# Patient Record
Sex: Female | Born: 1990 | Race: Black or African American | Hispanic: No | Marital: Single | Smoking: Current every day smoker
Health system: Southern US, Community
[De-identification: ages and names within clinical notes are randomized; demographics above are authoritative.]

## PROBLEM LIST (undated history)

## (undated) DIAGNOSIS — R569 Unspecified convulsions: Secondary | ICD-10-CM

## (undated) DIAGNOSIS — G43909 Migraine, unspecified, not intractable, without status migrainosus: Secondary | ICD-10-CM

---

## 2012-10-01 ENCOUNTER — Encounter (HOSPITAL_BASED_OUTPATIENT_CLINIC_OR_DEPARTMENT_OTHER): Payer: Self-pay | Admitting: *Deleted

## 2012-10-01 ENCOUNTER — Emergency Department (HOSPITAL_BASED_OUTPATIENT_CLINIC_OR_DEPARTMENT_OTHER): Payer: Self-pay

## 2012-10-01 ENCOUNTER — Emergency Department (HOSPITAL_BASED_OUTPATIENT_CLINIC_OR_DEPARTMENT_OTHER)
Admission: EM | Admit: 2012-10-01 | Discharge: 2012-10-01 | Disposition: A | Discharge status: Home / Self Care | Payer: Self-pay | Attending: Emergency Medicine | Admitting: Emergency Medicine

## 2012-10-01 DIAGNOSIS — Z3202 Encounter for pregnancy test, result negative: Secondary | ICD-10-CM | POA: Insufficient documentation

## 2012-10-01 DIAGNOSIS — F17200 Nicotine dependence, unspecified, uncomplicated: Secondary | ICD-10-CM | POA: Insufficient documentation

## 2012-10-01 DIAGNOSIS — R071 Chest pain on breathing: Secondary | ICD-10-CM | POA: Insufficient documentation

## 2012-10-01 DIAGNOSIS — Z79899 Other long term (current) drug therapy: Secondary | ICD-10-CM | POA: Insufficient documentation

## 2012-10-01 DIAGNOSIS — N390 Urinary tract infection, site not specified: Secondary | ICD-10-CM | POA: Insufficient documentation

## 2012-10-01 DIAGNOSIS — G40909 Epilepsy, unspecified, not intractable, without status epilepticus: Secondary | ICD-10-CM | POA: Insufficient documentation

## 2012-10-01 HISTORY — DX: Unspecified convulsions: R56.9

## 2012-10-01 LAB — URINALYSIS, ROUTINE W REFLEX MICROSCOPIC
Protein, ur: NEGATIVE mg/dL
Urobilinogen, UA: 0.2 mg/dL (ref 0.0–1.0)

## 2012-10-01 LAB — PREGNANCY, URINE: Preg Test, Ur: NEGATIVE

## 2012-10-01 LAB — URINE MICROSCOPIC-ADD ON

## 2012-10-01 MED ORDER — CEFTRIAXONE SODIUM 1 G IJ SOLR
1.0000 g | Freq: Once | INTRAMUSCULAR | Status: AC
  Administered 2012-10-01: 1 g via INTRAMUSCULAR
  Filled 2012-10-01: qty 10

## 2012-10-01 MED ORDER — CIPROFLOXACIN HCL 500 MG PO TABS: 500.0000 mg | ORAL_TABLET | Freq: Two times a day (BID) | ORAL | Status: DC

## 2012-10-01 MED ORDER — SODIUM CHLORIDE 0.9 % IV SOLN
Freq: Once | INTRAVENOUS | Status: AC
  Administered 2012-10-01: 21:00:00 via INTRAVENOUS

## 2012-10-01 MED ORDER — HYDROMORPHONE HCL PF 1 MG/ML IJ SOLN
1.0000 mg | Freq: Once | INTRAMUSCULAR | Status: AC
  Administered 2012-10-01: 1 mg via INTRAVENOUS
  Filled 2012-10-01: qty 1

## 2012-10-01 MED ORDER — PROMETHAZINE HCL 25 MG PO TABS: 25.0000 mg | ORAL_TABLET | Freq: Four times a day (QID) | ORAL | Status: AC | PRN

## 2012-10-01 MED ORDER — HYDROCODONE-ACETAMINOPHEN 5-325 MG PO TABS: 2.0000 | ORAL_TABLET | ORAL | Status: DC | PRN

## 2012-10-01 MED ORDER — ONDANSETRON HCL 4 MG/2ML IJ SOLN
4.0000 mg | Freq: Once | INTRAMUSCULAR | Status: AC
  Administered 2012-10-01: 4 mg via INTRAVENOUS
  Filled 2012-10-01: qty 2

## 2012-10-01 MED ORDER — LIDOCAINE HCL (PF) 1 % IJ SOLN
INTRAMUSCULAR | Status: AC
  Administered 2012-10-01: 2.1 mL
  Filled 2012-10-01: qty 5

## 2012-10-01 NOTE — ED Provider Notes (Signed)
9:10 PM  Date: 10/01/2012  Rate: 60  Rhythm: normal sinus rhythm and sinus arrhythmia  QRS Axis: normal  Intervals: normal  ST/T Wave abnormalities: normal  Conduction Disutrbances:none  Narrative Interpretation: Normal EKG  Old EKG Reviewed: none available    Carleene Cooper III, MD 10/01/12 2111

## 2012-10-01 NOTE — ED Provider Notes (Signed)
History     CSN: [id number]  Arrival date & time 10/01/12  2956   First MD Initiated Contact with Patient 10/01/12 2026      Chief Complaint  Patient presents with  . Abdominal Pain    (Consider location/radiation/quality/duration/timing/severity/associated sxs/prior treatment) Patient is a 22 y.o. female presenting with abdominal pain. The history is provided by the patient. No language interpreter was used.  Abdominal Pain Pain location:  Suprapubic Pain quality: aching   Pain radiates to:  Suprapubic region Onset quality:  Sudden Timing:  Constant Progression:  Worsening Relieved by:  Nothing Worsened by:  Nothing tried Associated symptoms: chest pain   Pt complains of pain in her low back.  Pt also complains of pain in her chest for 2 weeks  Past Medical History  Diagnosis Date  . Seizures     History reviewed. No pertinent past surgical history.  History reviewed. No pertinent family history.  History  Substance Use Topics  . Smoking status: Current Every Day Smoker -- 0.50 packs/day    Types: Cigarettes  . Smokeless tobacco: Not on file  . Alcohol Use: No    OB History   Grav Para Term Preterm Abortions TAB SAB Ect Mult Living                  Review of Systems  Cardiovascular: Positive for chest pain.  Gastrointestinal: Positive for abdominal pain.  All other systems reviewed and are negative.    Allergies  Review of patient's allergies indicates no known allergies.  Home Medications   Current Outpatient Rx  Name  Route  Sig  Dispense  Refill  . divalproex (DEPAKOTE) 500 MG DR tablet   Oral   Take 500 mg by mouth 3 (three) times daily.         . traZODone (DESYREL) 50 MG tablet   Oral   Take 50 mg by mouth at bedtime.           BP 135/73  Pulse 89  Temp(Src) 99.3 F (37.4 C) (Oral)  Resp 20  Ht 5\' 9"  (1.753 m)  Wt 160 lb (72.576 kg)  BMI 23.62 kg/m2  SpO2 99%  LMP 09/30/2012  Physical Exam  Nursing note and vitals  reviewed. Constitutional: She appears well-developed and well-nourished.  HENT:  Head: Normocephalic.  Right Ear: External ear normal.  Left Ear: External ear normal.  Nose: Nose normal.  Mouth/Throat: Oropharynx is clear and moist.  Eyes: Pupils are equal, round, and reactive to light.  Neck: Normal range of motion.  Cardiovascular: Normal rate, regular rhythm and normal heart sounds.   Pulmonary/Chest: Effort normal and breath sounds normal.  Abdominal: Soft.  Musculoskeletal: Normal range of motion.  Neurological: She is alert. She has normal reflexes.  Skin: Skin is warm.  Psychiatric: She has a normal mood and affect.    ED Course  Procedures (including critical care time)  Labs Reviewed  URINALYSIS, ROUTINE W REFLEX MICROSCOPIC - Abnormal; Notable for the following:    APPearance CLOUDY (*)    Hgb urine dipstick MODERATE (*)    Leukocytes, UA MODERATE (*)    All other components within normal limits  URINE MICROSCOPIC-ADD ON - Abnormal; Notable for the following:    Squamous Epithelial / LPF FEW (*)    Bacteria, UA FEW (*)    All other components within normal limits  URINE CULTURE  PREGNANCY, URINE   No results found.   No diagnosis found.   Date: 10/01/2012  Rate: 60  Rhythm: normal sinus rhythm  QRS Axis: normal  Intervals: normal  ST/T Wave abnormalities: normal  Conduction Disutrbances:none  Narrative Interpretation:   Old EKG Reviewed: none available   MDM   Results for orders placed during the hospital encounter of 10/01/12  URINALYSIS, ROUTINE W REFLEX MICROSCOPIC      Result Value Range   Color, Urine YELLOW  YELLOW   APPearance CLOUDY (*) CLEAR   Specific Gravity, Urine 1.023  1.005 - 1.030   pH 6.0  5.0 - 8.0   Glucose, UA NEGATIVE  NEGATIVE mg/dL   Hgb urine dipstick MODERATE (*) NEGATIVE   Bilirubin Urine NEGATIVE  NEGATIVE   Ketones, ur NEGATIVE  NEGATIVE mg/dL   Protein, ur NEGATIVE  NEGATIVE mg/dL   Urobilinogen, UA 0.2  0.0 - 1.0  mg/dL   Nitrite NEGATIVE  NEGATIVE   Leukocytes, UA MODERATE (*) NEGATIVE  PREGNANCY, URINE      Result Value Range   Preg Test, Ur NEGATIVE  NEGATIVE  URINE MICROSCOPIC-ADD ON      Result Value Range   Squamous Epithelial / LPF FEW (*) RARE   WBC, UA 21-50  <3 WBC/hpf   RBC / HPF 3-6  <3 RBC/hpf   Bacteria, UA FEW (*) RARE   Dg Chest 2 View Pt given Iv rocephin and zofran.    Rx for cipro bid,  Hydrocodone for pain,  zofran  10/01/2012  *RADIOLOGY REPORT*  Clinical Data: Abdominal pain, nausea, vomiting, and diarrhea for 1 hour.  CHEST - 2 VIEW  Comparison: None.  Findings: The heart size and pulmonary vascularity are normal. The lungs appear clear and expanded without focal air space disease or consolidation. No blunting of the costophrenic angles.  No pneumothorax.  Mediastinal contours appear intact.  IMPRESSION: No evidence of active pulmonary disease.   Original Report Authenticated By: Burman Nieves, M.D.            Lonia Skinner [place], [place] 10/01/12 2206

## 2012-10-01 NOTE — ED Notes (Signed)
Pt c/o abd pain with n/v/d x 1 hr 

## 2012-10-01 NOTE — ED Notes (Signed)
Pt reports still unable to give urine sample at this time.

## 2012-10-01 NOTE — ED Notes (Signed)
Karen, PA-C at bedside.  

## 2012-10-02 NOTE — ED Provider Notes (Signed)
Medical screening examination/treatment/procedure(s) were performed by non-physician practitioner and as supervising physician I was immediately available for consultation/collaboration.   Carleene Cooper III, MD 10/02/12 [phone]

## 2012-10-03 LAB — URINE CULTURE: Colony Count: 25000

## 2012-10-04 ENCOUNTER — Telehealth (HOSPITAL_COMMUNITY): Payer: Self-pay | Admitting: Emergency Medicine

## 2012-10-04 NOTE — ED Notes (Signed)
Patient has +Urine culture. Checking to see if appropriately treated. °

## 2012-10-04 NOTE — ED Notes (Signed)
+  Urine. Patient treated with Cipro. Sensitive to same. Per protocol MD. °

## 2013-02-07 ENCOUNTER — Encounter (HOSPITAL_BASED_OUTPATIENT_CLINIC_OR_DEPARTMENT_OTHER): Payer: Self-pay | Admitting: *Deleted

## 2013-02-07 ENCOUNTER — Emergency Department (HOSPITAL_BASED_OUTPATIENT_CLINIC_OR_DEPARTMENT_OTHER)
Admission: EM | Admit: 2013-02-07 | Discharge: 2013-02-07 | Disposition: A | Discharge status: Home / Self Care | Payer: Self-pay | Attending: Emergency Medicine | Admitting: Emergency Medicine

## 2013-02-07 ENCOUNTER — Emergency Department (HOSPITAL_BASED_OUTPATIENT_CLINIC_OR_DEPARTMENT_OTHER): Payer: Self-pay

## 2013-02-07 DIAGNOSIS — R296 Repeated falls: Secondary | ICD-10-CM | POA: Insufficient documentation

## 2013-02-07 DIAGNOSIS — F17200 Nicotine dependence, unspecified, uncomplicated: Secondary | ICD-10-CM | POA: Insufficient documentation

## 2013-02-07 DIAGNOSIS — F10920 Alcohol use, unspecified with intoxication, uncomplicated: Secondary | ICD-10-CM

## 2013-02-07 DIAGNOSIS — W19XXXA Unspecified fall, initial encounter: Secondary | ICD-10-CM

## 2013-02-07 DIAGNOSIS — Y929 Unspecified place or not applicable: Secondary | ICD-10-CM | POA: Insufficient documentation

## 2013-02-07 DIAGNOSIS — F1010 Alcohol abuse, uncomplicated: Secondary | ICD-10-CM | POA: Insufficient documentation

## 2013-02-07 DIAGNOSIS — IMO0002 Reserved for concepts with insufficient information to code with codable children: Secondary | ICD-10-CM | POA: Insufficient documentation

## 2013-02-07 DIAGNOSIS — T148XXA Other injury of unspecified body region, initial encounter: Secondary | ICD-10-CM

## 2013-02-07 DIAGNOSIS — G40909 Epilepsy, unspecified, not intractable, without status epilepticus: Secondary | ICD-10-CM | POA: Insufficient documentation

## 2013-02-07 DIAGNOSIS — Z79899 Other long term (current) drug therapy: Secondary | ICD-10-CM | POA: Insufficient documentation

## 2013-02-07 DIAGNOSIS — R1110 Vomiting, unspecified: Secondary | ICD-10-CM | POA: Insufficient documentation

## 2013-02-07 DIAGNOSIS — Y939 Activity, unspecified: Secondary | ICD-10-CM | POA: Insufficient documentation

## 2013-02-07 MED ORDER — DIVALPROEX SODIUM 250 MG PO DR TAB
250.0000 mg | DELAYED_RELEASE_TABLET | Freq: Once | ORAL | Status: AC
  Administered 2013-02-07: 250 mg via ORAL
  Filled 2013-02-07: qty 1

## 2013-02-07 MED ORDER — ONDANSETRON 8 MG PO TBDP
ORAL_TABLET | ORAL | Status: AC
  Filled 2013-02-07: qty 1

## 2013-02-07 MED ORDER — ONDANSETRON 8 MG PO TBDP: ORAL_TABLET | ORAL | Status: DC

## 2013-02-07 MED ORDER — SODIUM CHLORIDE 0.9 % IV SOLN
Freq: Once | INTRAVENOUS | Status: AC
  Administered 2013-02-07: 05:00:00 via INTRAVENOUS

## 2013-02-07 MED ORDER — ONDANSETRON HCL 4 MG/2ML IJ SOLN
4.0000 mg | Freq: Once | INTRAMUSCULAR | Status: AC
  Administered 2013-02-07: 4 mg via INTRAVENOUS
  Filled 2013-02-07: qty 2

## 2013-02-07 MED ORDER — ONDANSETRON 8 MG PO TBDP
8.0000 mg | ORAL_TABLET | Freq: Once | ORAL | Status: AC
  Administered 2013-02-07: 8 mg via ORAL

## 2013-02-07 NOTE — ED Provider Notes (Signed)
History    CSN: [id number] Arrival date & time 02/07/13  0356  First MD Initiated Contact with Patient 02/07/13 [phone]     Chief Complaint  Patient presents with  . Fall  . Head Injury  . Alcohol Intoxication  . Emesis   (Consider location/radiation/quality/duration/timing/severity/associated sxs/prior Treatment) Patient is a 22 y.o. female presenting with fall and vomiting. The history is provided by the patient and a relative.  Fall This is a new problem. The current episode started 1 to 2 hours ago. The problem occurs constantly. The problem has not changed since onset.Nothing aggravates the symptoms. Nothing relieves the symptoms. She has tried nothing for the symptoms. The treatment provided no relief.  Emesis Severity:  Moderate Timing:  Constant Quality:  Stomach contents Progression:  Resolved Chronicity:  New Context: not self-induced   Relieved by:  Nothing Worsened by:  Nothing tried Ineffective treatments:  None tried Risk factors: alcohol use   Risk factors comment:  Drank 2 bottles of patron and ate a huge meal   Past Medical History  Diagnosis Date  . Seizures    No past surgical history on file. No family history on file. History  Substance Use Topics  . Smoking status: Current Every Day Smoker -- 0.50 packs/day    Types: Cigarettes  . Smokeless tobacco: Not on file  . Alcohol Use: No   OB History   Grav Para Term Preterm Abortions TAB SAB Ect Mult Living                 Review of Systems  Gastrointestinal: Positive for vomiting.  All other systems reviewed and are negative.    Allergies  Review of patient's allergies indicates no known allergies.  Home Medications   Current Outpatient Rx  Name  Route  Sig  Dispense  Refill  . ciprofloxacin (CIPRO) 500 MG tablet   Oral   Take 1 tablet (500 mg total) by mouth every 12 (twelve) hours.   14 tablet   0   . divalproex (DEPAKOTE) 500 MG DR tablet   Oral   Take 500 mg by mouth 3 (three)  times daily.         Marland Kitchen HYDROcodone-acetaminophen (NORCO/VICODIN) 5-325 MG per tablet   Oral   Take 2 tablets by mouth every 4 (four) hours as needed for pain.   10 tablet   0   . promethazine (PHENERGAN) 25 MG tablet   Oral   Take 1 tablet (25 mg total) by mouth every 6 (six) hours as needed for nausea.   12 tablet   0   . traZODone (DESYREL) 50 MG tablet   Oral   Take 50 mg by mouth at bedtime.          There were no vitals taken for this visit. Physical Exam  Constitutional: She appears well-developed and well-nourished.  HENT:  Head: Head is without raccoon's eyes and without Battle's sign.    Right Ear: No hemotympanum.  Left Ear: No hemotympanum.  Mouth/Throat: Oropharynx is clear and moist.  Eyes: Conjunctivae are normal. Pupils are equal, round, and reactive to light.  Neck: No tracheal deviation present.  c collar applied  Cardiovascular: Normal rate, regular rhythm and intact distal pulses.   Pulmonary/Chest: Effort normal and breath sounds normal. No respiratory distress. She has no wheezes. She has no rales.  Abdominal: Soft. Bowel sounds are normal. There is no tenderness. There is no rebound and no guarding.  Musculoskeletal: Normal range of motion.  She exhibits no tenderness.  Neurological: She is alert. She has normal reflexes.  Skin: Skin is warm and dry.    ED Course  Procedures (including critical care time) Labs Reviewed - No data to display No results found. No diagnosis found.  MDM  Patient is awake and alert. Vomiting is from alcohol ingestion. Will given 1 liter bolus. Have given patient a dose of depakote as she has not taken her evening medication.    Jasmine Awe, MD 02/07/13 [phone]
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
## 2013-02-07 NOTE — ED Notes (Signed)
Patient transported to CT 

## 2013-03-25 ENCOUNTER — Emergency Department (HOSPITAL_BASED_OUTPATIENT_CLINIC_OR_DEPARTMENT_OTHER)
Admission: EM | Admit: 2013-03-25 | Discharge: 2013-03-25 | Disposition: A | Discharge status: Home / Self Care | Payer: No Typology Code available for payment source | Attending: Emergency Medicine | Admitting: Emergency Medicine

## 2013-03-25 ENCOUNTER — Encounter (HOSPITAL_BASED_OUTPATIENT_CLINIC_OR_DEPARTMENT_OTHER): Payer: Self-pay | Admitting: *Deleted

## 2013-03-25 DIAGNOSIS — Y92410 Unspecified street and highway as the place of occurrence of the external cause: Secondary | ICD-10-CM | POA: Insufficient documentation

## 2013-03-25 DIAGNOSIS — Y9389 Activity, other specified: Secondary | ICD-10-CM | POA: Insufficient documentation

## 2013-03-25 DIAGNOSIS — S39012A Strain of muscle, fascia and tendon of lower back, initial encounter: Secondary | ICD-10-CM

## 2013-03-25 DIAGNOSIS — G40909 Epilepsy, unspecified, not intractable, without status epilepticus: Secondary | ICD-10-CM | POA: Insufficient documentation

## 2013-03-25 DIAGNOSIS — S335XXA Sprain of ligaments of lumbar spine, initial encounter: Secondary | ICD-10-CM | POA: Insufficient documentation

## 2013-03-25 DIAGNOSIS — Z79899 Other long term (current) drug therapy: Secondary | ICD-10-CM | POA: Insufficient documentation

## 2013-03-25 DIAGNOSIS — F17200 Nicotine dependence, unspecified, uncomplicated: Secondary | ICD-10-CM | POA: Insufficient documentation

## 2013-03-25 MED ORDER — CYCLOBENZAPRINE HCL 10 MG PO TABS: 10.0000 mg | ORAL_TABLET | Freq: Two times a day (BID) | ORAL | Status: DC | PRN

## 2013-03-25 MED ORDER — HYDROCODONE-ACETAMINOPHEN 5-325 MG PO TABS: 1.0000 | ORAL_TABLET | Freq: Four times a day (QID) | ORAL | Status: DC | PRN
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
## 2013-11-05 ENCOUNTER — Encounter (HOSPITAL_BASED_OUTPATIENT_CLINIC_OR_DEPARTMENT_OTHER): Payer: Self-pay | Admitting: Emergency Medicine

## 2013-11-05 ENCOUNTER — Emergency Department (HOSPITAL_BASED_OUTPATIENT_CLINIC_OR_DEPARTMENT_OTHER)
Admission: EM | Admit: 2013-11-05 | Discharge: 2013-11-05 | Disposition: A | Discharge status: Home / Self Care | Payer: No Typology Code available for payment source | Attending: Emergency Medicine | Admitting: Emergency Medicine

## 2013-11-05 DIAGNOSIS — F17200 Nicotine dependence, unspecified, uncomplicated: Secondary | ICD-10-CM | POA: Insufficient documentation

## 2013-11-05 DIAGNOSIS — Z792 Long term (current) use of antibiotics: Secondary | ICD-10-CM | POA: Insufficient documentation

## 2013-11-05 DIAGNOSIS — G40909 Epilepsy, unspecified, not intractable, without status epilepticus: Secondary | ICD-10-CM | POA: Insufficient documentation

## 2013-11-05 DIAGNOSIS — G43909 Migraine, unspecified, not intractable, without status migrainosus: Secondary | ICD-10-CM | POA: Insufficient documentation

## 2013-11-05 DIAGNOSIS — Z79899 Other long term (current) drug therapy: Secondary | ICD-10-CM | POA: Insufficient documentation

## 2013-11-05 HISTORY — DX: Migraine, unspecified, not intractable, without status migrainosus: G43.909

## 2013-11-05 MED ORDER — KETOROLAC TROMETHAMINE 30 MG/ML IJ SOLN
30.0000 mg | Freq: Once | INTRAMUSCULAR | Status: AC
  Administered 2013-11-05: 30 mg via INTRAVENOUS
  Filled 2013-11-05: qty 1

## 2013-11-05 MED ORDER — SODIUM CHLORIDE 0.9 % IV SOLN
Freq: Once | INTRAVENOUS | Status: AC
  Administered 2013-11-05: 14:00:00 via INTRAVENOUS

## 2013-11-05 MED ORDER — SUMATRIPTAN SUCCINATE 100 MG PO TABS: 100.0000 mg | ORAL_TABLET | ORAL | Status: AC | PRN

## 2013-11-05 MED ORDER — METOCLOPRAMIDE HCL 5 MG/ML IJ SOLN
10.0000 mg | Freq: Once | INTRAMUSCULAR | Status: AC
  Administered 2013-11-05: 10 mg via INTRAVENOUS
  Filled 2013-11-05: qty 2

## 2013-11-05 MED ORDER — DIPHENHYDRAMINE HCL 50 MG/ML IJ SOLN
25.0000 mg | Freq: Once | INTRAMUSCULAR | Status: AC
Start: 2013-11-05 — End: 2013-11-05
  Administered 2013-11-05: 25 mg via INTRAVENOUS
  Filled 2013-11-05: qty 1

## 2013-11-05 NOTE — ED Provider Notes (Signed)
CSN: [id number]     Arrival date & time 11/05/13  1258 History   First MD Initiated Contact with Patient 11/05/13 1304     Chief Complaint  Patient presents with  . Migraine     (Consider location/radiation/quality/duration/timing/severity/associated sxs/prior Treatment) Patient is a 23 y.o. female presenting with migraines. The history is provided by the patient. No language interpreter was used.  Migraine This is a new problem. The current episode started in the past 7 days. The problem occurs constantly. The problem has been gradually worsening. Associated symptoms include nausea and vomiting. Pertinent negatives include no abdominal pain, fever or rash. Nothing aggravates the symptoms. She has tried nothing for the symptoms. The treatment provided no relief.    Past Medical History  Diagnosis Date  . Seizures   . Migraine    History reviewed. No pertinent past surgical history. History reviewed. No pertinent family history. History  Substance Use Topics  . Smoking status: Current Every Day Smoker -- 0.50 packs/day    Types: Cigarettes  . Smokeless tobacco: Never Used  . Alcohol Use: No   OB History   Grav Para Term Preterm Abortions TAB SAB Ect Mult Living                 Review of Systems  Constitutional: Negative for fever.  Gastrointestinal: Positive for nausea and vomiting. Negative for abdominal pain.  Skin: Negative for rash.  All other systems reviewed and are negative.      Allergies  Review of patient's allergies indicates no known allergies.  Home Medications   Current Outpatient Rx  Name  Route  Sig  Dispense  Refill  . ciprofloxacin (CIPRO) 500 MG tablet   Oral   Take 1 tablet (500 mg total) by mouth every 12 (twelve) hours.   14 tablet   0   . cyclobenzaprine (FLEXERIL) 10 MG tablet   Oral   Take 1 tablet (10 mg total) by mouth 2 (two) times daily as needed for muscle spasms.   20 tablet   0   . divalproex (DEPAKOTE) 500 MG DR tablet  Oral   Take 500 mg by mouth 3 (three) times daily.         Marland Kitchen HYDROcodone-acetaminophen (NORCO/VICODIN) 5-325 MG per tablet   Oral   Take 2 tablets by mouth every 4 (four) hours as needed for pain.   10 tablet   0   . HYDROcodone-acetaminophen (NORCO/VICODIN) 5-325 MG per tablet   Oral   Take 1 tablet by mouth every 6 (six) hours as needed for pain.   15 tablet   0   . ondansetron (ZOFRAN ODT) 8 MG disintegrating tablet      8mg  ODT q8 hours prn nausea   4 tablet   0   . promethazine (PHENERGAN) 25 MG tablet   Oral   Take 1 tablet (25 mg total) by mouth every 6 (six) hours as needed for nausea.   12 tablet   0   . traZODone (DESYREL) 50 MG tablet   Oral   Take 50 mg by mouth at bedtime.          BP 134/75  Pulse 61  Temp(Src) 98.2 F (36.8 C) (Oral)  Resp 16  Ht 5\' 9"  (1.753 m)  Wt 210 lb (95.255 kg)  BMI 31.00 kg/m2  SpO2 100% Physical Exam  Nursing note and vitals reviewed. Constitutional: She is oriented to person, place, and time. She appears well-developed and well-nourished.  HENT:  Head: Normocephalic and atraumatic.  Right Ear: External ear normal.  Left Ear: External ear normal.  Mouth/Throat: Oropharynx is clear and moist.  Eyes: Conjunctivae and EOM are normal. Pupils are equal, round, and reactive to light.  Neck: Normal range of motion.  Cardiovascular: Normal rate, regular rhythm and normal heart sounds.   Pulmonary/Chest: Effort normal and breath sounds normal.  Abdominal: Soft. She exhibits no distension.  Musculoskeletal: Normal range of motion.  Neurological: She is alert and oriented to person, place, and time.  Skin: Skin is warm.  Psychiatric: She has a normal mood and affect.    ED Course  Procedures (including critical care time) Labs Review Labs Reviewed - No data to display Imaging Review No results found.   EKG Interpretation None      MDM   Final diagnoses:  Migraine    Pt feels better after iv fluids,  torodol, reglan and benadryl    Elson AreasLeslie K Makylee Sanborn, PA-C 11/05/13 1621

## 2013-11-05 NOTE — Discharge Instructions (Signed)
Migraine Headache A migraine headache is an intense, throbbing pain on one or both sides of your head. A migraine can last for 30 minutes to several hours. CAUSES  The exact cause of a migraine headache is not always known. However, a migraine may be caused when nerves in the brain become irritated and release chemicals that cause inflammation. This causes pain. Certain things may also trigger migraines, such as:  Alcohol.  Smoking.  Stress.  Menstruation.  Aged cheeses.  Foods or drinks that contain nitrates, glutamate, aspartame, or tyramine.  Lack of sleep.  Chocolate.  Caffeine.  Hunger.  Physical exertion.  Fatigue.  Medicines used to treat chest pain (nitroglycerine), birth control pills, estrogen, and some blood pressure medicines. SIGNS AND SYMPTOMS  Pain on one or both sides of your head.  Pulsating or throbbing pain.  Severe pain that prevents daily activities.  Pain that is aggravated by any physical activity.  Nausea, vomiting, or both.  Dizziness.  Pain with exposure to bright lights, loud noises, or activity.  General sensitivity to bright lights, loud noises, or smells. Before you get a migraine, you may get warning signs that a migraine is coming (aura). An aura may include:  Seeing flashing lights.  Seeing bright spots, halos, or zig-zag lines.  Having tunnel vision or blurred vision.  Having feelings of numbness or tingling.  Having trouble talking.  Having muscle weakness. DIAGNOSIS  A migraine headache is often diagnosed based on:  Symptoms.  Physical exam.  A CT scan or MRI of your head. These imaging tests cannot diagnose migraines, but they can help rule out other causes of headaches. TREATMENT Medicines may be given for pain and nausea. Medicines can also be given to help prevent recurrent migraines.  HOME CARE INSTRUCTIONS  Only take over-the-counter or prescription medicines for pain or discomfort as directed by your  health care provider. The use of long-term narcotics is not recommended.  Lie down in a dark, quiet room when you have a migraine.  Keep a journal to find out what may trigger your migraine headaches. For example, write down:  What you eat and drink.  How much sleep you get.  Any change to your diet or medicines.  Limit alcohol consumption.  Quit smoking if you smoke.  Get 7 9 hours of sleep, or as recommended by your health care provider.  Limit stress.  Keep lights dim if bright lights bother you and make your migraines worse. SEEK IMMEDIATE MEDICAL CARE IF:   Your migraine becomes severe.  You have a fever.  You have a stiff neck.  You have vision loss.  You have muscular weakness or loss of muscle control.  You start losing your balance or have trouble walking.  You feel faint or pass out.  You have severe symptoms that are different from your first symptoms. MAKE SURE YOU:   Understand these instructions.  Will watch your condition.  Will get help right away if you are not doing well or get worse. Document Released: 07/24/2005 Document Revised: 05/14/2013 Document Reviewed: 03/31/2013 [employer] Patient Information 2014 [employer].  

## 2013-11-05 NOTE — ED Notes (Signed)
Pt sts severe headache starting 5 days ago; +n/v, also reports hands shaking and diaphoretic since yesterday; lights and sounds sensitive. Pt reports hx of migraines.

## 2013-11-08 NOTE — ED Provider Notes (Signed)
Medical screening examination/treatment/procedure(s) were performed by non-physician practitioner and as supervising physician I was immediately available for consultation/collaboration.   EKG Interpretation None        Charles B. Sheldon, MD 11/08/13 0941 

## 2014-03-06 DIAGNOSIS — Z3202 Encounter for pregnancy test, result negative: Secondary | ICD-10-CM | POA: Insufficient documentation

## 2014-03-06 DIAGNOSIS — R071 Chest pain on breathing: Secondary | ICD-10-CM | POA: Insufficient documentation

## 2014-03-06 DIAGNOSIS — F17200 Nicotine dependence, unspecified, uncomplicated: Secondary | ICD-10-CM | POA: Insufficient documentation

## 2014-03-06 DIAGNOSIS — G43909 Migraine, unspecified, not intractable, without status migrainosus: Secondary | ICD-10-CM | POA: Insufficient documentation

## 2014-03-06 DIAGNOSIS — R079 Chest pain, unspecified: Secondary | ICD-10-CM | POA: Insufficient documentation

## 2014-03-06 DIAGNOSIS — N3000 Acute cystitis without hematuria: Secondary | ICD-10-CM | POA: Insufficient documentation

## 2014-03-06 DIAGNOSIS — Z79899 Other long term (current) drug therapy: Secondary | ICD-10-CM | POA: Insufficient documentation

## 2014-03-07 ENCOUNTER — Emergency Department (HOSPITAL_BASED_OUTPATIENT_CLINIC_OR_DEPARTMENT_OTHER): Payer: Self-pay

## 2014-03-07 ENCOUNTER — Other Ambulatory Visit: Payer: Self-pay

## 2014-03-07 ENCOUNTER — Emergency Department (HOSPITAL_BASED_OUTPATIENT_CLINIC_OR_DEPARTMENT_OTHER)
Admission: EM | Admit: 2014-03-07 | Discharge: 2014-03-07 | Disposition: A | Discharge status: Home / Self Care | Payer: Self-pay | Attending: Emergency Medicine | Admitting: Emergency Medicine

## 2014-03-07 ENCOUNTER — Encounter (HOSPITAL_BASED_OUTPATIENT_CLINIC_OR_DEPARTMENT_OTHER): Payer: Self-pay | Admitting: Emergency Medicine

## 2014-03-07 DIAGNOSIS — R0789 Other chest pain: Secondary | ICD-10-CM

## 2014-03-07 DIAGNOSIS — N3000 Acute cystitis without hematuria: Secondary | ICD-10-CM

## 2014-03-07 LAB — COMPREHENSIVE METABOLIC PANEL
ALT: 10 U/L (ref 0–35)
AST: 14 U/L (ref 0–37)
Albumin: 3.8 g/dL (ref 3.5–5.2)
Alkaline Phosphatase: 63 U/L (ref 39–117)
Anion gap: 11 (ref 5–15)
BUN: 9 mg/dL (ref 6–23)
CALCIUM: 9.3 mg/dL (ref 8.4–10.5)
CO2: 25 mEq/L (ref 19–32)
CREATININE: 0.8 mg/dL (ref 0.50–1.10)
Chloride: 104 mEq/L (ref 96–112)
GFR calc Af Amer: >90 mL/min (ref >90)
GFR calc non Af Amer: >90 mL/min (ref >90)
Glucose, Bld: 98 mg/dL (ref 70–99)
Potassium: 3.8 mEq/L (ref 3.7–5.3)
SODIUM: 140 meq/L (ref 137–147)
TOTAL PROTEIN: 7.2 g/dL (ref 6.0–8.3)
Total Bilirubin: 0.2 mg/dL — ABNORMAL LOW (ref 0.3–1.2)

## 2014-03-07 LAB — URINALYSIS, ROUTINE W REFLEX MICROSCOPIC
Bilirubin Urine: NEGATIVE
GLUCOSE, UA: NEGATIVE mg/dL
Ketones, ur: NEGATIVE mg/dL
Nitrite: NEGATIVE
PROTEIN: NEGATIVE mg/dL
SPECIFIC GRAVITY, URINE: 1.014 (ref 1.005–1.030)
UROBILINOGEN UA: 0.2 mg/dL (ref 0.0–1.0)
pH: 6 (ref 5.0–8.0)

## 2014-03-07 LAB — CBC WITH DIFFERENTIAL/PLATELET
Basophils Absolute: 0 10*3/uL (ref 0.0–0.1)
Basophils Relative: 0 % (ref 0–1)
EOS ABS: 0.2 10*3/uL (ref 0.0–0.7)
Eosinophils Relative: 2 % (ref 0–5)
HCT: 36.4 % (ref 36.0–46.0)
HEMOGLOBIN: 12.6 g/dL (ref 12.0–15.0)
LYMPHS PCT: 31 % (ref 12–46)
Lymphs Abs: 3.9 10*3/uL (ref 0.7–4.0)
MCH: 29.1 pg (ref 26.0–34.0)
MCHC: 34.6 g/dL (ref 30.0–36.0)
MCV: 84.1 fL (ref 78.0–100.0)
MONOS PCT: 7 % (ref 3–12)
Monocytes Absolute: 0.9 10*3/uL (ref 0.1–1.0)
Neutro Abs: 7.3 10*3/uL (ref 1.7–7.7)
Neutrophils Relative %: 60 % (ref 43–77)
Platelets: 280 10*3/uL (ref 150–400)
RBC: 4.33 MIL/uL (ref 3.87–5.11)
RDW: 12.8 % (ref 11.5–15.5)
WBC: 12.3 10*3/uL — AB (ref 4.0–10.5)

## 2014-03-07 LAB — URINE MICROSCOPIC-ADD ON

## 2014-03-07 LAB — PREGNANCY, URINE: PREG TEST UR: NEGATIVE

## 2014-03-07 MED ORDER — CIPROFLOXACIN HCL 500 MG PO TABS: 500.0000 mg | ORAL_TABLET | Freq: Two times a day (BID) | ORAL | Status: DC

## 2014-03-07 MED ORDER — HYDROCODONE-ACETAMINOPHEN 5-325 MG PO TABS
1.0000 | ORAL_TABLET | Freq: Four times a day (QID) | ORAL | Status: DC | PRN
Start: 2014-03-07 — End: 2014-03-22

## 2014-03-07 MED ORDER — CIPROFLOXACIN HCL 500 MG PO TABS
500.0000 mg | ORAL_TABLET | Freq: Once | ORAL | Status: AC
  Administered 2014-03-07: 500 mg via ORAL
  Filled 2014-03-07: qty 1

## 2014-03-07 NOTE — ED Provider Notes (Signed)
CSN: [id number]     Arrival date & time 03/06/14  2355 History   First MD Initiated Contact with Patient 03/07/14 0104     Chief Complaint  Patient presents with  . Chest Pain     (Consider location/radiation/quality/duration/timing/severity/associated sxs/prior Treatment) HPI This is a 23 year old female who has had a cough for the past 5 days. The cough is moderate in severity. During that same time she has had the gradual development of pain in her right upper, lower and lateral chest. She is also having pain in her right upper quadrant and right flank. Pain is worse with palpation, movement or deep breathing. She denies shortness of breath. She denies fever or chills. 2 days ago she had nausea, vomiting and diarrhea that lasted about 3 hours but that has resolved.  Past Medical History  Diagnosis Date  . Seizures   . Migraine    History reviewed. No pertinent past surgical history. History reviewed. No pertinent family history. History  Substance Use Topics  . Smoking status: Current Every Day Smoker -- 0.50 packs/day    Types: Cigarettes  . Smokeless tobacco: Never Used  . Alcohol Use: No   OB History   Grav Para Term Preterm Abortions TAB SAB Ect Mult Living                 Review of Systems  All other systems reviewed and are negative.   Allergies  Review of patient's allergies indicates no known allergies.  Home Medications   Prior to Admission medications   Medication Sig Start Date End Date Taking? Authorizing Provider  divalproex (DEPAKOTE) 500 MG DR tablet Take 500 mg by mouth 3 (three) times daily.   Yes Historical Provider, MD  SUMAtriptan (IMITREX) 100 MG tablet Take 1 tablet (100 mg total) by mouth every 2 (two) hours as needed for migraine or headache. May repeat in 2 hours if headache persists or recurs. 11/05/13  Yes Lonia SkinnerLeslie K Sofia, PA-C  traZODone (DESYREL) 50 MG tablet Take 50 mg by mouth at bedtime.   Yes Historical Provider, MD  ciprofloxacin (CIPRO)  500 MG tablet Take 1 tablet (500 mg total) by mouth every 12 (twelve) hours. 10/01/12   Elson AreasLeslie K Sofia, PA-C  cyclobenzaprine (FLEXERIL) 10 MG tablet Take 1 tablet (10 mg total) by mouth 2 (two) times daily as needed for muscle spasms. 03/25/13   Gwyneth SproutWhitney Plunkett, MD  HYDROcodone-acetaminophen (NORCO/VICODIN) 5-325 MG per tablet Take 2 tablets by mouth every 4 (four) hours as needed for pain. 10/01/12   Elson AreasLeslie K Sofia, PA-C  HYDROcodone-acetaminophen (NORCO/VICODIN) 5-325 MG per tablet Take 1 tablet by mouth every 6 (six) hours as needed for pain. 03/25/13   Gwyneth SproutWhitney Plunkett, MD  ondansetron (ZOFRAN ODT) 8 MG disintegrating tablet 8mg  ODT q8 hours prn nausea 02/07/13   April K Palumbo-Rasch, MD  promethazine (PHENERGAN) 25 MG tablet Take 1 tablet (25 mg total) by mouth every 6 (six) hours as needed for nausea. 10/01/12   Elson AreasLeslie K Sofia, PA-C   BP 138/76  Pulse 70  Temp(Src) 98.5 F (36.9 C) (Oral)  Resp 18  Ht 5\' 8"  (1.727 m)  Wt 210 lb (95.255 kg)  BMI 31.94 kg/m2  SpO2 100%  LMP 03/02/2014  Physical Exam General: Well-developed, well-nourished female in no acute distress; appearance consistent with age of record HENT: normocephalic; atraumatic Eyes: pupils equal, round and reactive to light; extraocular muscles intact Neck: supple Heart: regular rate and rhythm; no murmur Lungs: clear to auscultation bilaterally Chest:  Right upper, lower and lateral chest wall tenderness without deformity or crepitus Abdomen: soft; nondistended; right upper quadrant tenderness; no masses or hepatosplenomegaly; bowel sounds present GU: Right CVA tenderness Extremities: No deformity; full range of motion; pulses normal; no edema Neurologic: Awake, alert and oriented; motor function intact in all extremities and symmetric; no facial droop Skin: Warm and dry Psychiatric: Normal mood and affect    ED Course  Procedures (including critical care time)   EKG Interpretation   Date/Time:  Saturday March 07 2014 00:40:32 EDT Ventricular Rate:  69 PR Interval:  162 QRS Duration: 88 QT Interval:  390 QTC Calculation: 417 R Axis:   66 Text Interpretation:  Normal sinus rhythm with sinus arrhythmia Normal ECG  No significant change was found Confirmed by Anabeth Chilcott  MD, Jonny Ruiz ([id number]) on  03/07/2014 1:04:44 AM      MDM   Nursing notes and vitals signs, including pulse oximetry, reviewed.  Summary of this visit's results, reviewed by myself:  Labs:  Results for orders placed during the hospital encounter of 03/07/14 (from the past 24 hour(s))  URINALYSIS, ROUTINE W REFLEX MICROSCOPIC     Status: Abnormal   Collection Time    03/07/14 12:10 AM      Result Value Ref Range   Color, Urine YELLOW  YELLOW   APPearance CLOUDY (*) CLEAR   Specific Gravity, Urine 1.014  1.005 - 1.030   pH 6.0  5.0 - 8.0   Glucose, UA NEGATIVE  NEGATIVE mg/dL   Hgb urine dipstick SMALL (*) NEGATIVE   Bilirubin Urine NEGATIVE  NEGATIVE   Ketones, ur NEGATIVE  NEGATIVE mg/dL   Protein, ur NEGATIVE  NEGATIVE mg/dL   Urobilinogen, UA 0.2  0.0 - 1.0 mg/dL   Nitrite NEGATIVE  NEGATIVE   Leukocytes, UA MODERATE (*) NEGATIVE  PREGNANCY, URINE     Status: None   Collection Time    03/07/14 12:10 AM      Result Value Ref Range   Preg Test, Ur NEGATIVE  NEGATIVE  URINE MICROSCOPIC-ADD ON     Status: Abnormal   Collection Time    03/07/14 12:10 AM      Result Value Ref Range   Squamous Epithelial / LPF FEW (*) RARE   WBC, UA 11-20  <3 WBC/hpf   RBC / HPF 3-6  <3 RBC/hpf   Bacteria, UA FEW (*) RARE  COMPREHENSIVE METABOLIC PANEL     Status: Abnormal   Collection Time    03/07/14  1:35 AM      Result Value Ref Range   Sodium 140  137 - 147 mEq/L   Potassium 3.8  3.7 - 5.3 mEq/L   Chloride 104  96 - 112 mEq/L   CO2 25  19 - 32 mEq/L   Glucose, Bld 98  70 - 99 mg/dL   BUN 9  6 - 23 mg/dL   Creatinine, Ser 6.04  0.50 - 1.10 mg/dL   Calcium 9.3  8.4 - 54.0 mg/dL   Total Protein 7.2  6.0 - 8.3 g/dL   Albumin 3.8   3.5 - 5.2 g/dL   AST 14  0 - 37 U/L   ALT 10  0 - 35 U/L   Alkaline Phosphatase 63  39 - 117 U/L   Total Bilirubin 0.2 (*) 0.3 - 1.2 mg/dL   GFR calc non Af Amer >90  >90 mL/min   GFR calc Af Amer >90  >90 mL/min   Anion gap 11  5 -  15  CBC WITH DIFFERENTIAL     Status: Abnormal   Collection Time    03/07/14  1:35 AM      Result Value Ref Range   WBC 12.3 (*) 4.0 - 10.5 K/uL   RBC 4.33  3.87 - 5.11 MIL/uL   Hemoglobin 12.6  12.0 - 15.0 g/dL   HCT 16.1  09.6 - 04.5 %   MCV 84.1  78.0 - 100.0 fL   MCH 29.1  26.0 - 34.0 pg   MCHC 34.6  30.0 - 36.0 g/dL   RDW 40.9  81.1 - 91.4 %   Platelets 280  150 - 400 K/uL   Neutrophils Relative % 60  43 - 77 %   Neutro Abs 7.3  1.7 - 7.7 K/uL   Lymphocytes Relative 31  12 - 46 %   Lymphs Abs 3.9  0.7 - 4.0 K/uL   Monocytes Relative 7  3 - 12 %   Monocytes Absolute 0.9  0.1 - 1.0 K/uL   Eosinophils Relative 2  0 - 5 %   Eosinophils Absolute 0.2  0.0 - 0.7 K/uL   Basophils Relative 0  0 - 1 %   Basophils Absolute 0.0  0.0 - 0.1 K/uL    Imaging Studies: Dg Ribs Unilateral W/chest Right  03/07/2014   CLINICAL DATA:  Right rib pain.  EXAM: RIGHT RIBS AND CHEST - 3+ VIEW  COMPARISON:  Chest radiograph October 01, 2012  FINDINGS: No fracture or other bone lesions are seen involving the ribs. There is no evidence of pneumothorax or pleural effusion. Both lungs are clear. Heart size and mediastinal contours are within normal limits.  IMPRESSION: Negative.   Electronically Signed   By: Awilda Metro   On: 03/07/2014 01:38   2:22 AM The patient's chest wall pain and abdominal pain may or may not be due to the same etiology. She clearly has a urinary tract infection and we will treat her for possible early pyelonephritis. Her chest pain may be due  Chest wall irritation from her cough. She was advised to return for worsening or changing symptoms.     Hanley Seamen, MD 03/07/14 [phone]

## 2014-03-07 NOTE — ED Notes (Signed)
Patient states she feels knots and pain sometimes and wonders if she has pancriatitus associated with her previous etoh use. I notified nurse Fannie KneeSue of patient's concerns.

## 2014-03-07 NOTE — ED Notes (Signed)
Right sided chest and rib pain since Monday which worsened yesterday after coughing and vomiting. Pt denies injury, no fever.

## 2014-03-08 LAB — URINE CULTURE: Colony Count: 50000

## 2014-03-09 ENCOUNTER — Telehealth (HOSPITAL_COMMUNITY): Payer: Self-pay

## 2014-03-09 NOTE — ED Notes (Signed)
Post ED Visit - Positive Culture Follow-up  Culture report reviewed by antimicrobial stewardship pharmacist: []  Wes Dulaney, Pharm.D., BCPS []  Celedonio MiyamotoJeremy Frens, Pharm.D., BCPS []  Georgina PillionElizabeth Martin, Pharm.D., BCPS []  CooperstownMinh Pham, [address] Rainbow BoulevardPharm.D., BCPS, AAHIVP [x]  Estella HuskMichelle Turner, Pharm.D., BCPS, AAHIVP []  Red ChristiansSamson Lee, Pharm.D. []  Tennis Mustassie Stewart, Pharm.D.  Positive urine culture Treated with cipro, organism sensitive to the same and no further patient follow-up is required at this time.  Ashley JacobsFesterman, Gregoire Bennis C 03/09/2014, 11:13 AM

## 2014-03-22 ENCOUNTER — Emergency Department (HOSPITAL_BASED_OUTPATIENT_CLINIC_OR_DEPARTMENT_OTHER)
Admission: EM | Admit: 2014-03-22 | Discharge: 2014-03-22 | Disposition: A | Discharge status: Home / Self Care | Payer: No Typology Code available for payment source | Attending: Emergency Medicine | Admitting: Emergency Medicine

## 2014-03-22 ENCOUNTER — Encounter (HOSPITAL_BASED_OUTPATIENT_CLINIC_OR_DEPARTMENT_OTHER): Payer: Self-pay | Admitting: Emergency Medicine

## 2014-03-22 DIAGNOSIS — IMO0002 Reserved for concepts with insufficient information to code with codable children: Secondary | ICD-10-CM | POA: Insufficient documentation

## 2014-03-22 DIAGNOSIS — L299 Pruritus, unspecified: Secondary | ICD-10-CM | POA: Insufficient documentation

## 2014-03-22 DIAGNOSIS — F17200 Nicotine dependence, unspecified, uncomplicated: Secondary | ICD-10-CM | POA: Insufficient documentation

## 2014-03-22 DIAGNOSIS — G43909 Migraine, unspecified, not intractable, without status migrainosus: Secondary | ICD-10-CM | POA: Insufficient documentation

## 2014-03-22 DIAGNOSIS — Z79899 Other long term (current) drug therapy: Secondary | ICD-10-CM | POA: Insufficient documentation

## 2014-03-22 DIAGNOSIS — L509 Urticaria, unspecified: Secondary | ICD-10-CM | POA: Insufficient documentation

## 2014-03-22 DIAGNOSIS — G40909 Epilepsy, unspecified, not intractable, without status epilepticus: Secondary | ICD-10-CM | POA: Insufficient documentation

## 2014-03-22 MED ORDER — PERMETHRIN 5 % EX CREA: TOPICAL_CREAM | CUTANEOUS | Status: DC

## 2014-03-22 MED ORDER — PREDNISONE 10 MG PO TABS: 20.0000 mg | ORAL_TABLET | Freq: Every day | ORAL | Status: AC

## 2014-03-22 MED ORDER — DEXAMETHASONE SODIUM PHOSPHATE 10 MG/ML IJ SOLN
10.0000 mg | Freq: Once | INTRAMUSCULAR | Status: AC
  Administered 2014-03-22: 10 mg via INTRAMUSCULAR
  Filled 2014-03-22: qty 1

## 2014-03-22 NOTE — ED Provider Notes (Signed)
Medical screening examination/treatment/procedure(s) were performed by non-physician practitioner and as supervising physician I was immediately available for consultation/collaboration.   EKG Interpretation None       Ethelda ChickMartha K Linker, MD 03/22/14 [phone]

## 2014-03-22 NOTE — Discharge Instructions (Signed)
Take prednisone as directed beginning tomorrow. You may also take Benadryl every 6 hours as needed for itching.  Hives Hives are itchy, red, swollen areas of the skin. They can vary in size and location on your body. Hives can come and go for hours or several days (acute hives) or for several weeks (chronic hives). Hives do not spread from person to person (noncontagious). They may get worse with scratching, exercise, and emotional stress. CAUSES   Allergic reaction to food, additives, or drugs.  Infections, including the common cold.  Illness, such as vasculitis, lupus, or thyroid disease.  Exposure to sunlight, heat, or cold.  Exercise.  Stress.  Contact with chemicals. SYMPTOMS   Red or white swollen patches on the skin. The patches may change size, shape, and location quickly and repeatedly.  Itching.  Swelling of the hands, feet, and face. This may occur if hives develop deeper in the skin. DIAGNOSIS  Your caregiver can usually tell what is wrong by performing a physical exam. Skin or blood tests may also be done to determine the cause of your hives. In some cases, the cause cannot be determined. TREATMENT  Mild cases usually get better with medicines such as antihistamines. Severe cases may require an emergency epinephrine injection. If the cause of your hives is known, treatment includes avoiding that trigger.  HOME CARE INSTRUCTIONS   Avoid causes that trigger your hives.  Take antihistamines as directed by your caregiver to reduce the severity of your hives. Non-sedating or low-sedating antihistamines are usually recommended. Do not drive while taking an antihistamine.  Take any other medicines prescribed for itching as directed by your caregiver.  Wear loose-fitting clothing.  Keep all follow-up appointments as directed by your caregiver. SEEK MEDICAL CARE IF:   You have persistent or severe itching that is not relieved with medicine.  You have painful or  swollen joints. SEEK IMMEDIATE MEDICAL CARE IF:   You have a fever.  Your tongue or lips are swollen.  You have trouble breathing or swallowing.  You feel tightness in the throat or chest.  You have abdominal pain. These problems may be the first sign of a life-threatening allergic reaction. Call your local emergency services (911 in [place] MAKE SURE YOU:   Understand these instructions.  Will watch your condition.  Will get help right away if you are not doing well or get worse. Document Released: 07/24/2005 Document Revised: 07/29/2013 Document Reviewed: 10/17/2011 [facility] Patient Information 2015 [employer], [place]. This information is not intended to replace advice given to you by your health care provider. Make sure you discuss any questions you have with your health care provider.

## 2014-03-22 NOTE — ED Provider Notes (Signed)
CSN: [id number]     Arrival date & time 03/22/14  1205 History   First MD Initiated Contact with Patient 03/22/14 1211     Chief Complaint  Patient presents with  . Abscess     (Consider location/radiation/quality/duration/timing/severity/associated sxs/prior Treatment) HPI Comments: Patient is a 23 year old female who presents to the emergency department complaining of itching, redness and pain with raised areas on her arms beginning earlier this morning. Patient reports she woke up in her arm surgery itchy and throughout the day she developed these large, round raised areas that are painful. She recently stayed at a hotel and is not sure she was bit by something, however her partner who was with her does not have the same symptoms. Denies fever, chills or drainage. No known allergies. Denies any new soaps, detergents or lotions. No difficulty breathing or swallowing.  Patient is a 23 y.o. female presenting with abscess. The history is provided by the patient.  Abscess   Past Medical History  Diagnosis Date  . Seizures   . Migraine    History reviewed. No pertinent past surgical history. No family history on file. History  Substance Use Topics  . Smoking status: Current Every Day Smoker -- 0.50 packs/day    Types: Cigarettes  . Smokeless tobacco: Never Used  . Alcohol Use: No   OB History   Grav Para Term Preterm Abortions TAB SAB Ect Mult Living                 Review of Systems  Skin: Positive for color change.       +itching.  All other systems reviewed and are negative.     Allergies  Review of patient's allergies indicates no known allergies.  Home Medications   Prior to Admission medications   Medication Sig Start Date End Date Taking? Authorizing Provider  divalproex (DEPAKOTE) 500 MG DR tablet Take 500 mg by mouth 3 (three) times daily.   Yes Historical Provider, MD  traZODone (DESYREL) 50 MG tablet Take 50 mg by mouth at bedtime.   Yes Historical Provider,  MD  predniSONE (DELTASONE) 10 MG tablet Take 2 tablets (20 mg total) by mouth daily. x3 days 03/22/14   Trevor Mace, PA-C  promethazine (PHENERGAN) 25 MG tablet Take 1 tablet (25 mg total) by mouth every 6 (six) hours as needed for nausea. 10/01/12   Elson Areas, PA-C  SUMAtriptan (IMITREX) 100 MG tablet Take 1 tablet (100 mg total) by mouth every 2 (two) hours as needed for migraine or headache. May repeat in 2 hours if headache persists or recurs. 11/05/13   Elson Areas, PA-C   BP 105/53  Pulse 67  Temp(Src) 97.3 F (36.3 C) (Oral)  Resp 18  Wt 212 lb (96.163 kg)  SpO2 100%  LMP 03/02/2014 Physical Exam  Nursing note and vitals reviewed. Constitutional: She is oriented to person, place, and time. She appears well-developed and well-nourished. No distress.  HENT:  Head: Normocephalic and atraumatic.  Mouth/Throat: Oropharynx is clear and moist.  Eyes: Conjunctivae and EOM are normal.  Neck: Normal range of motion. Neck supple.  Cardiovascular: Normal rate, regular rhythm and normal heart sounds.   Pulmonary/Chest: Effort normal and breath sounds normal. No respiratory distress.  Musculoskeletal: Normal range of motion. She exhibits no edema.  2 cm large urticaria on right forearm. Tender. Two large urticaria ~4 cm diameter on left arm. Tender. No fluctuance or induration. No secondary infection.  Neurological: She is alert and oriented  to person, place, and time. No sensory deficit.  Skin: Skin is warm and dry.  Psychiatric: She has a normal mood and affect. Her behavior is normal.    ED Course  Procedures (including critical care time) Labs Review Labs Reviewed - No data to display  Imaging Review No results found.   EKG Interpretation None      MDM   Final diagnoses:  Urticaria   Patient was 3 large urticaria. She is nontoxic appearing and in no apparent distress. No respiratory or airway compromise. Afebrile, vital signs stable. No evidence of abscess. No  fluctuance or induration. Decadron given in the emergency department. Short course of oral prednisone. Advised her to take Benadryl and apply ice to the area. Stable for d/c. Return precautions given. Patient states understanding of treatment care plan and is agreeable.  Trevor MaceRobyn M Albert, PA-C 03/22/14 1436

## 2014-03-22 NOTE — ED Notes (Signed)
Patient here with left arm abscess x 2 and right arm swelling as well. Redness and tenderness to same. Thinks she was bitten by something

## 2014-06-10 IMAGING — CT CT CERVICAL SPINE W/O CM
3 of 4 series · 13 of 33 positions shown, 16 images · non-contrast
Comparison: None

CT HEAD

CLINICAL DATA: Fall.  Headache.  Neck pain.  Nausea.  Vomiting.

CT HEAD WITHOUT CONTRAST
CT CERVICAL SPINE WITHOUT CONTRAST
TECHNIQUE: Multidetector CT imaging of the head and cervical spine
was performed following the standard protocol without intravenous
contrast.  Multiplanar CT image reconstructions of the cervical
spine were also generated.

[Series 3: c_spine 2.0 b41s st · axial · 0.27mm/px · z∈[-268,-156]mm · 5 of 84 slices shown, 7 images]
[im 14/84  soft-tissue]
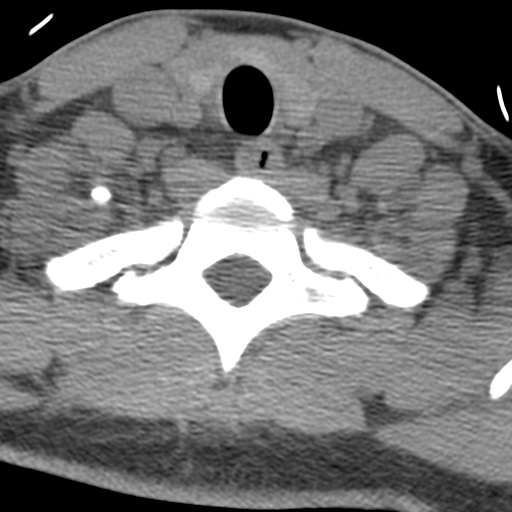
[im 14/84  bone]
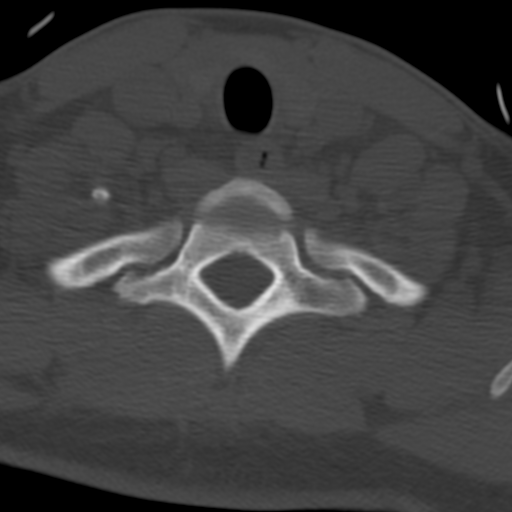
[im 28/84  bone]
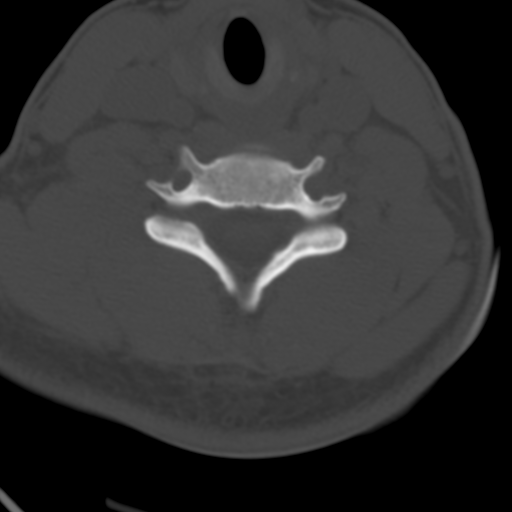
[im 42/84  bone]
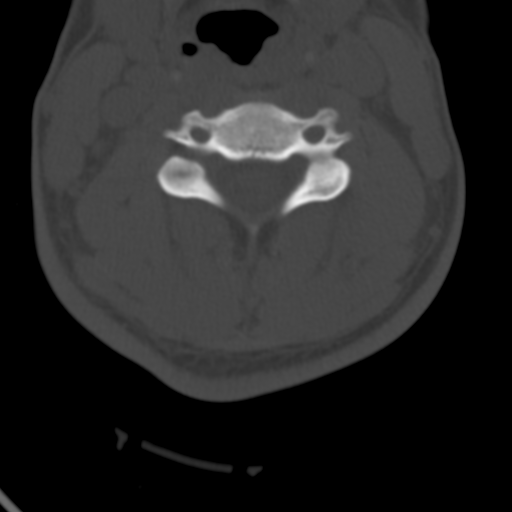
[im 56/84  bone]
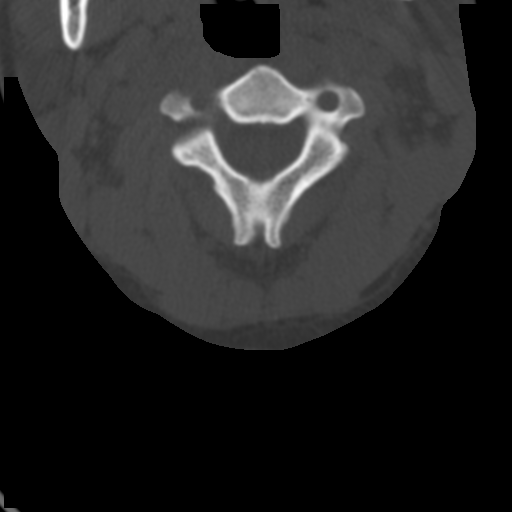
[im 70/84  soft-tissue]
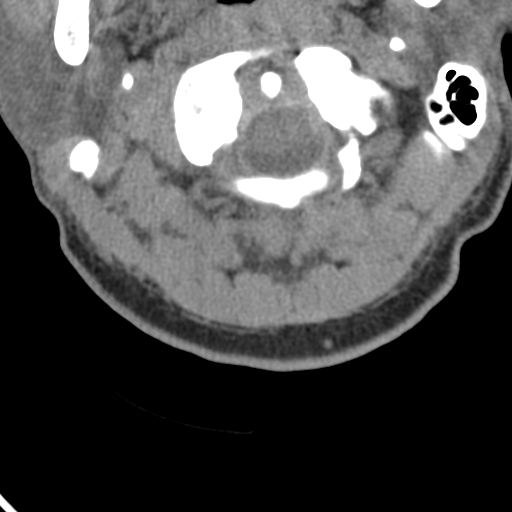
[im 70/84  bone]
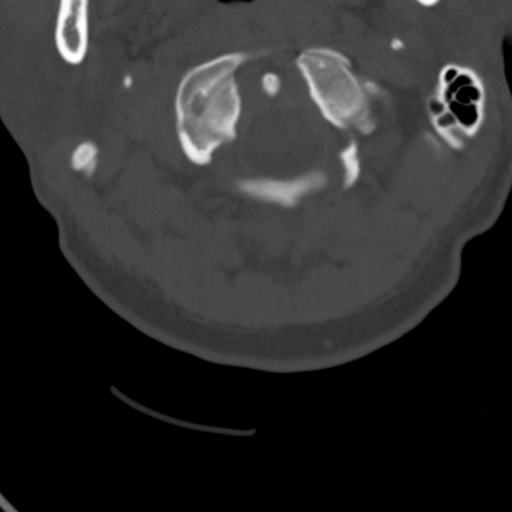

[Series 6: c_spine 2.0 coronal · coronal · 0.25mm/px · 3 of 48 slices shown]
[im 10/48  bone]
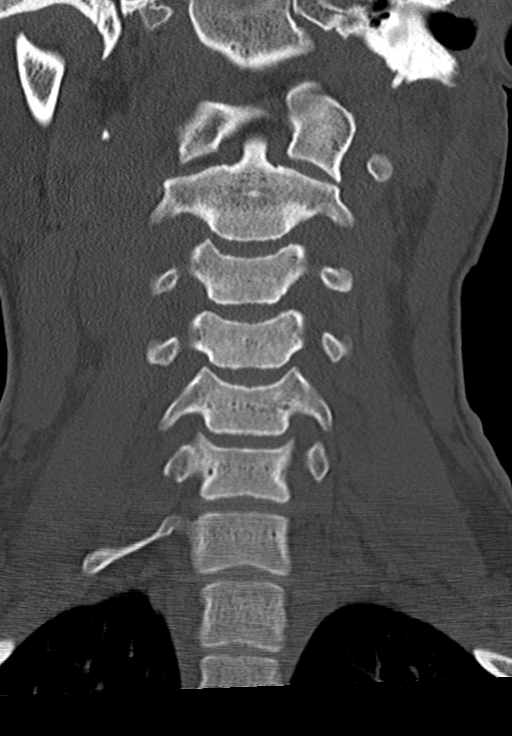
[im 19/48  bone]
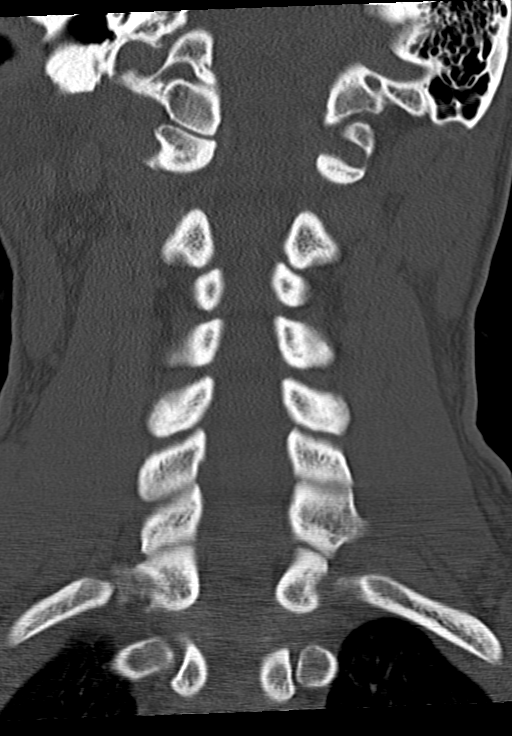
[im 29/48  bone]
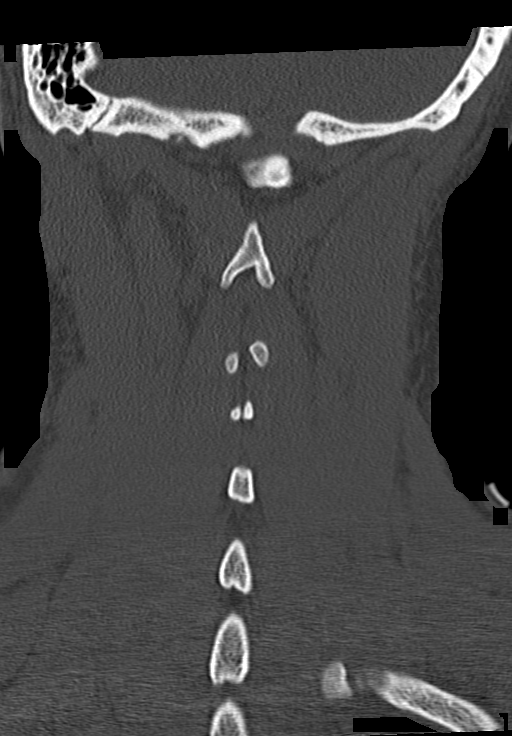

[Series 7: c_spine 2.0 sagittal · sagittal · 0.28mm/px · 5 of 47 slices shown, 6 images]
[im 16/47  bone]
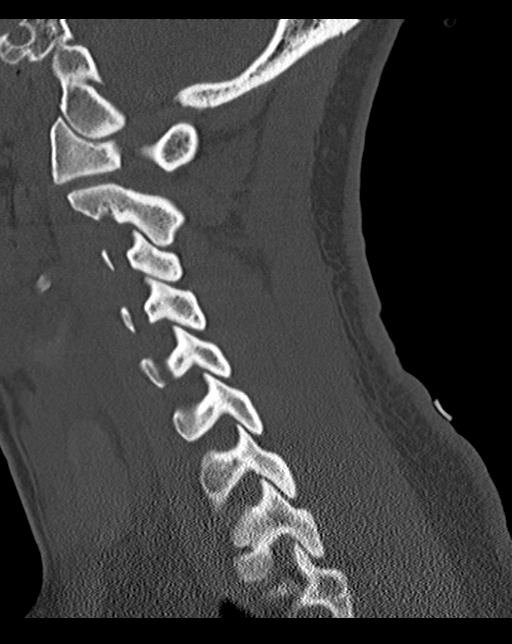
[im 20/47  bone]
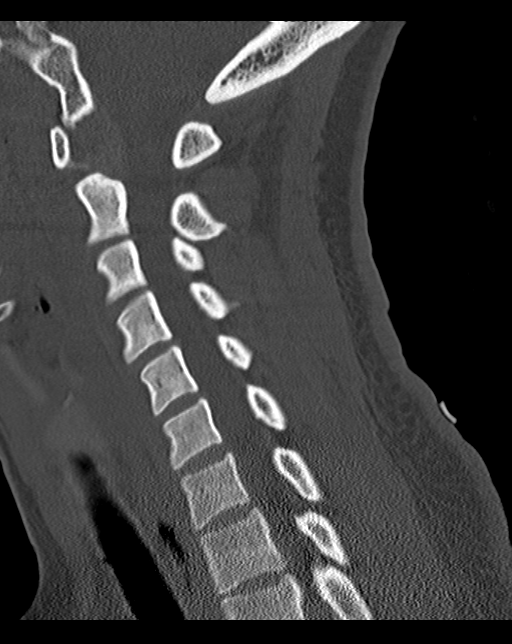
[im 24/47  soft-tissue]
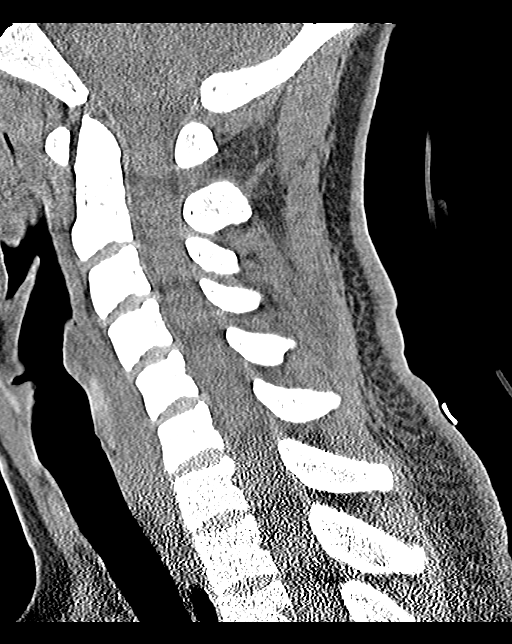
[im 24/47  bone]
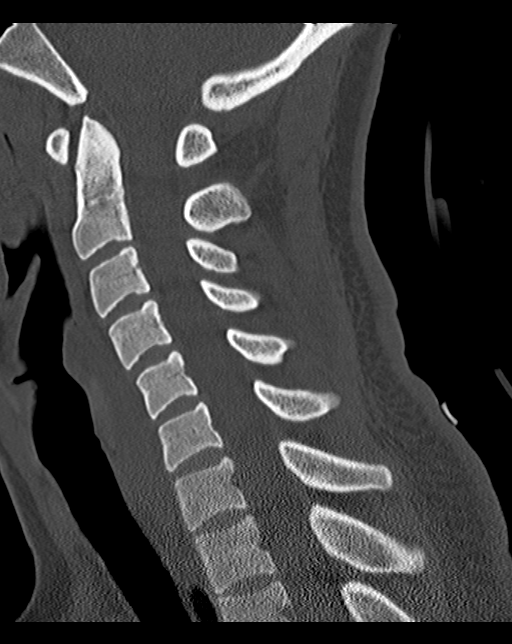
[im 27/47  bone]
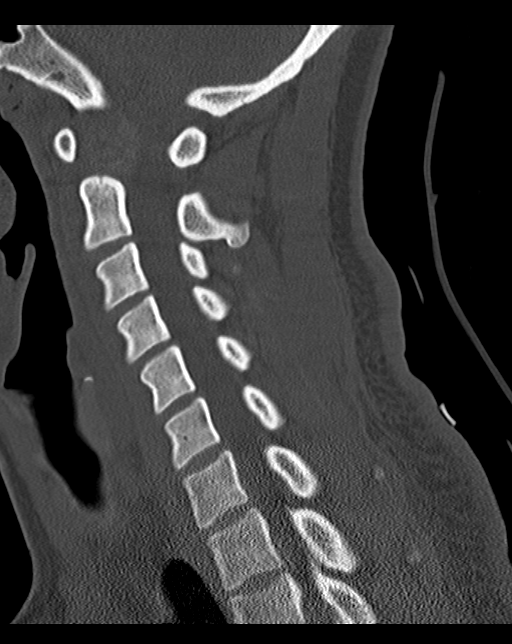
[im 31/47  bone]
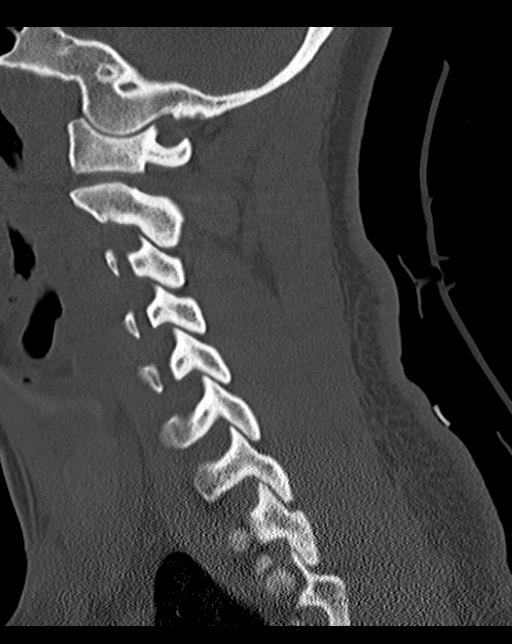

[13 of 33 positions shown; findings below may reference images not displayed]

FINDINGS: No mass lesion, mass effect, midline shift,
hydrocephalus, hemorrhage.  No territorial ischemia or acute
infarction.  Calvarium intact.  Scout image shows mild soft tissue
swelling over the superior aspect of the skull.
IMPRESSION: Negative CT head.

CT CERVICAL SPINE
FINDINGS: Straightening of the normal cervical lordosis.  No
fracture, subluxation, or dislocation.  odontoid intact.  Occipital
condyles are within normal limits.  Lung apices appear normal.
Paraspinal soft tissues are normal.
IMPRESSION: Straightening of the normal cervical lordosis probably secondary to
cervical collar.  No fracture, subluxation, or dislocation.

## 2015-07-08 IMAGING — CR DG RIBS W/ CHEST 3+V*R*
4 series · 4 of 4 positions shown · non-contrast
Comparison: Chest radiograph October 01, 2012

CLINICAL DATA: Right rib pain.

EXAM:
RIGHT RIBS AND CHEST - 3+ VIEW

[w chest pa]
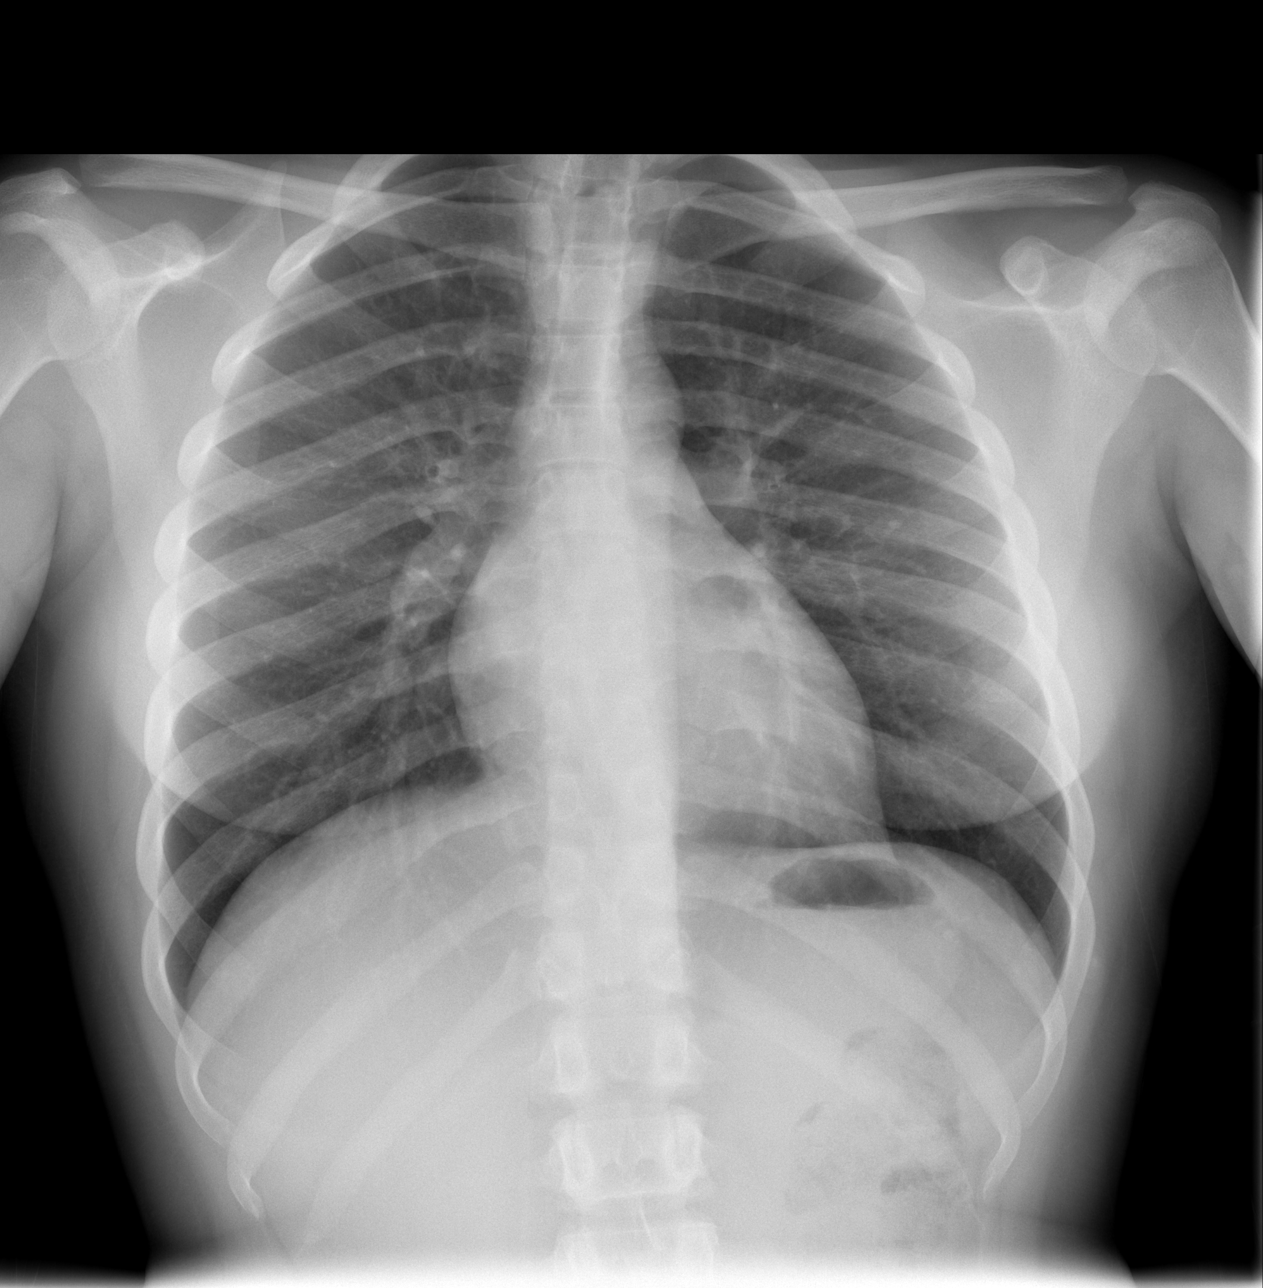

[w ribs ap/pa upper right]
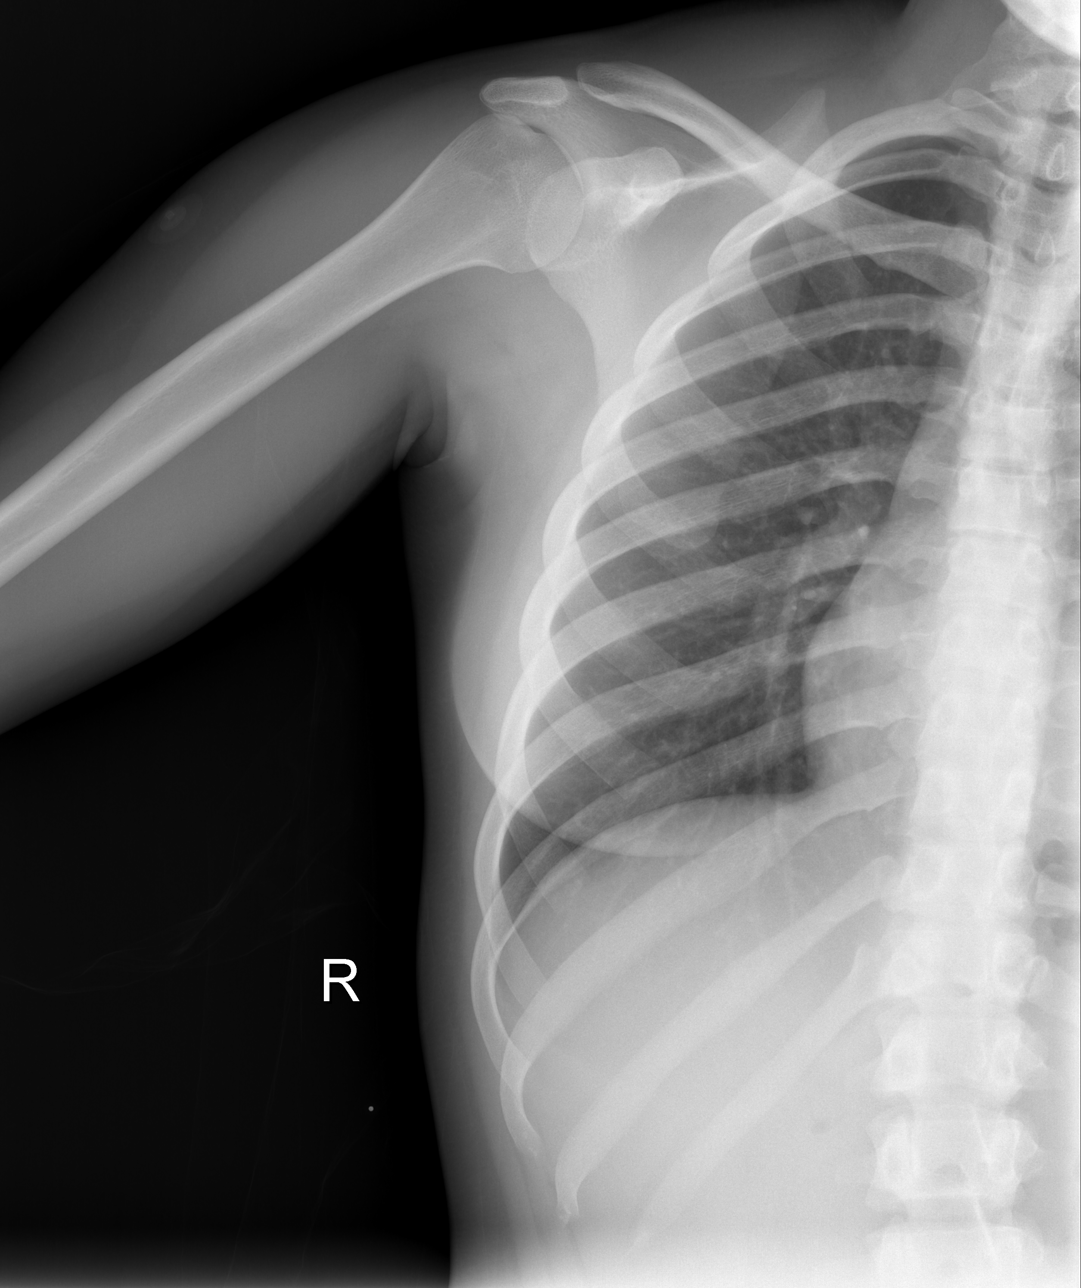

[w ribs oblique right (1 of 2)]
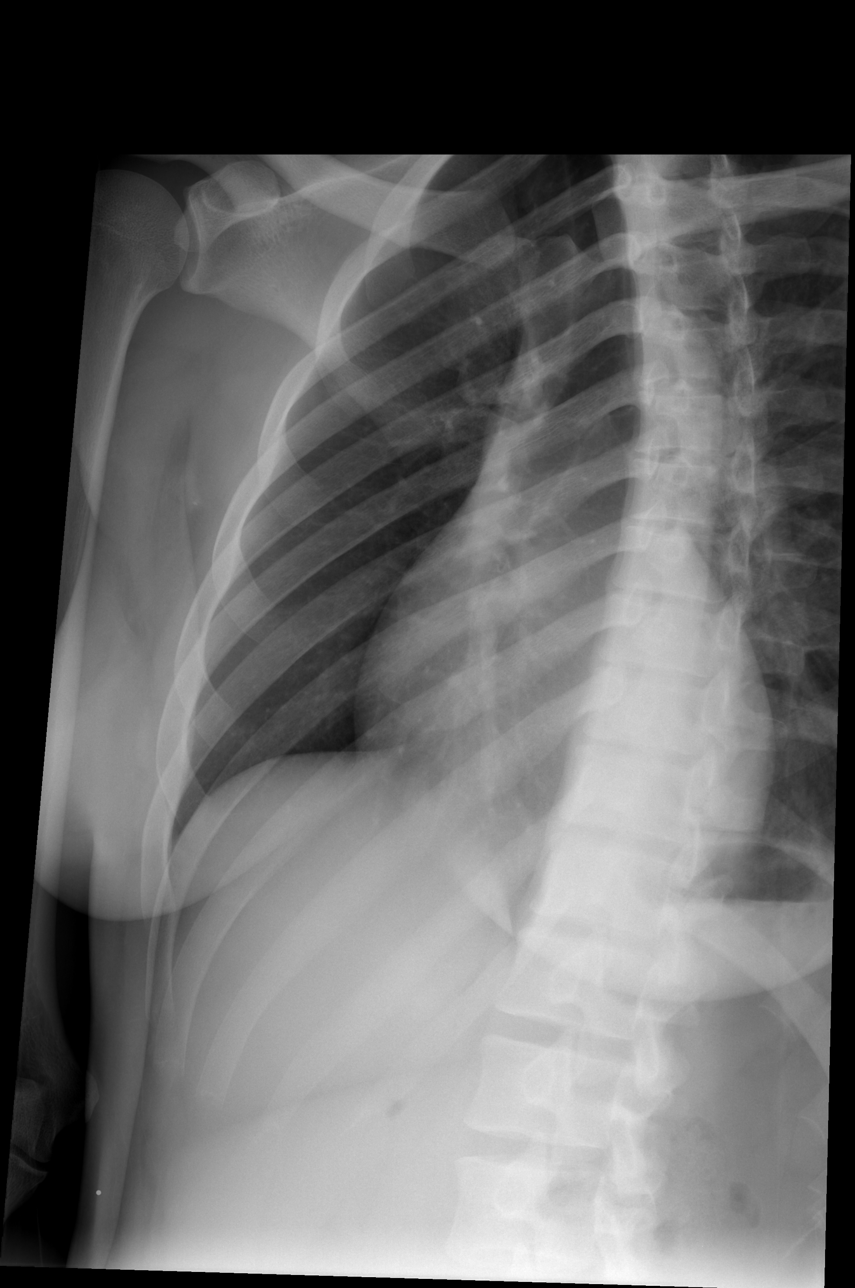

[w ribs oblique right (2 of 2)]
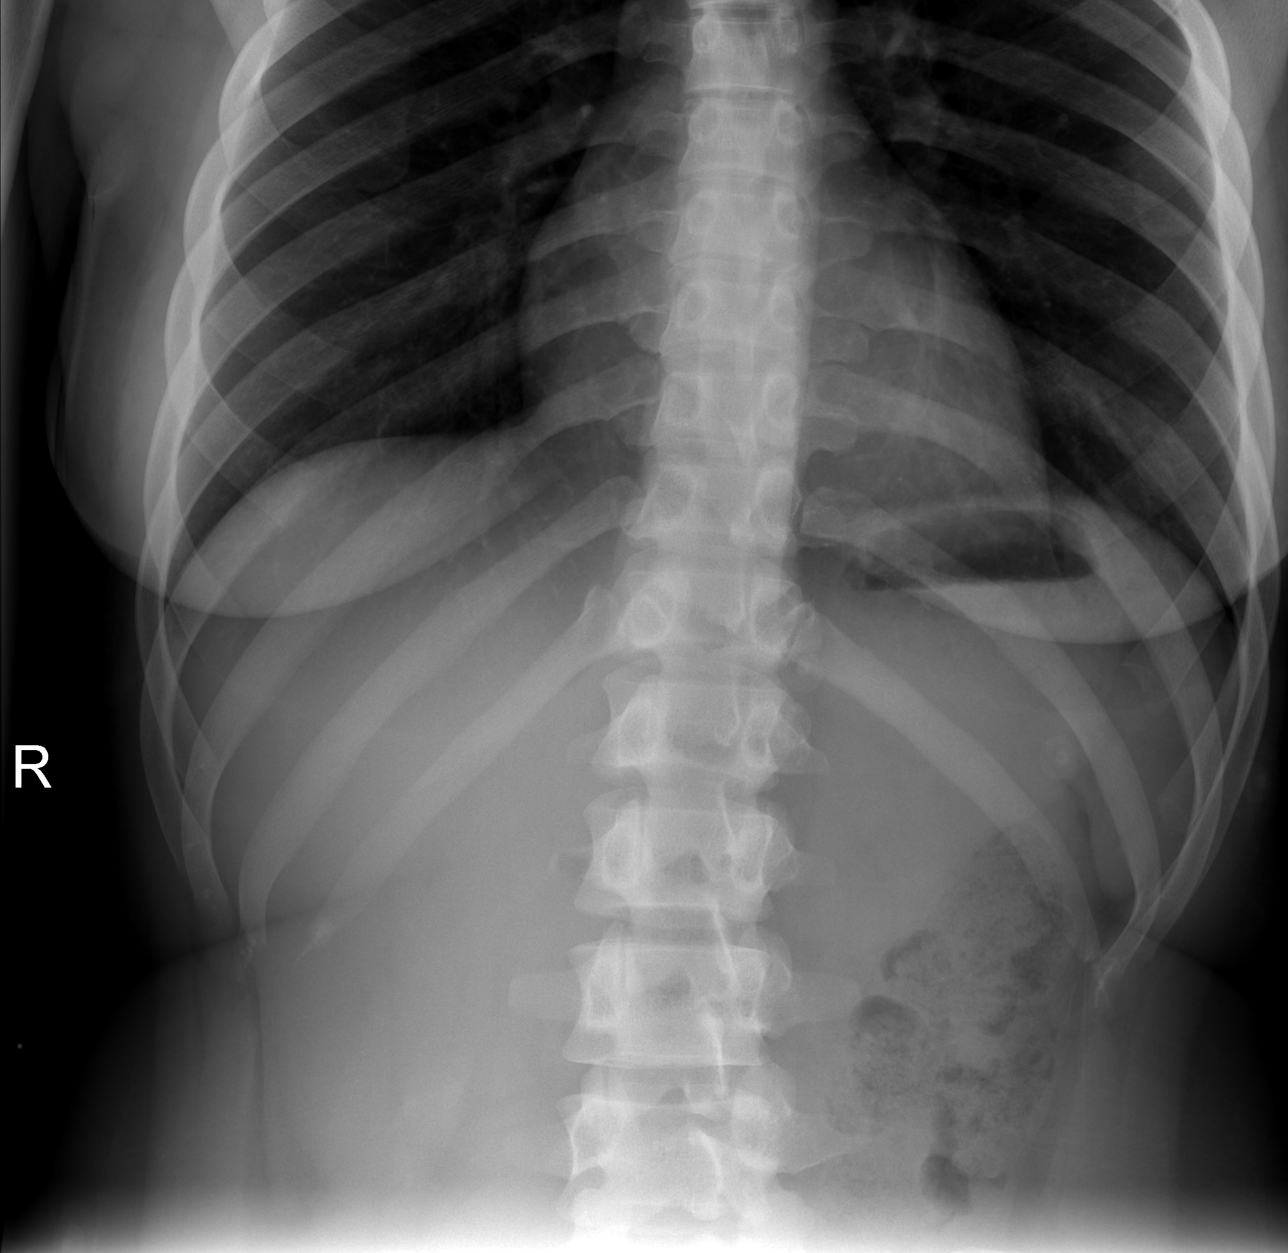

[4 of 4 positions shown; findings below may reference images not displayed]

FINDINGS: No fracture or other bone lesions are seen involving the ribs. There
is no evidence of pneumothorax or pleural effusion. Both lungs are
clear. Heart size and mediastinal contours are within normal limits.
IMPRESSION: Negative.

  By: Mirella Jeong
